# Patient Record
Sex: Female | Born: 1995 | Race: White | Hispanic: Yes | Marital: Single | Smoking: Never smoker
Health system: Southern US, Community
[De-identification: ages and names within clinical notes are randomized; demographics above are authoritative.]

## PROBLEM LIST (undated history)

## (undated) ENCOUNTER — Inpatient Hospital Stay (HOSPITAL_COMMUNITY): Payer: Self-pay

## (undated) DIAGNOSIS — T8859XA Other complications of anesthesia, initial encounter: Secondary | ICD-10-CM

## (undated) DIAGNOSIS — D649 Anemia, unspecified: Secondary | ICD-10-CM

## (undated) DIAGNOSIS — F329 Major depressive disorder, single episode, unspecified: Secondary | ICD-10-CM

## (undated) DIAGNOSIS — F32A Depression, unspecified: Secondary | ICD-10-CM

## (undated) DIAGNOSIS — T4145XA Adverse effect of unspecified anesthetic, initial encounter: Secondary | ICD-10-CM

---
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
## 2014-06-06 ENCOUNTER — Encounter (HOSPITAL_COMMUNITY): Payer: Self-pay | Admitting: Emergency Medicine

## 2015-01-18 ENCOUNTER — Encounter (HOSPITAL_COMMUNITY): Payer: Self-pay | Admitting: Emergency Medicine

## 2015-01-18 ENCOUNTER — Emergency Department (HOSPITAL_COMMUNITY): Payer: Medicaid Other

## 2015-01-18 ENCOUNTER — Emergency Department (HOSPITAL_COMMUNITY)
Admission: EM | Admit: 2015-01-18 | Discharge: 2015-01-18 | Disposition: A | Discharge status: Home / Self Care | Payer: Medicaid Other | Attending: Emergency Medicine | Admitting: Emergency Medicine

## 2015-01-18 DIAGNOSIS — Y92009 Unspecified place in unspecified non-institutional (private) residence as the place of occurrence of the external cause: Secondary | ICD-10-CM | POA: Insufficient documentation

## 2015-01-18 DIAGNOSIS — Z862 Personal history of diseases of the blood and blood-forming organs and certain disorders involving the immune mechanism: Secondary | ICD-10-CM | POA: Insufficient documentation

## 2015-01-18 DIAGNOSIS — S29001A Unspecified injury of muscle and tendon of front wall of thorax, initial encounter: Secondary | ICD-10-CM | POA: Diagnosis present

## 2015-01-18 DIAGNOSIS — Z792 Long term (current) use of antibiotics: Secondary | ICD-10-CM | POA: Insufficient documentation

## 2015-01-18 DIAGNOSIS — R0602 Shortness of breath: Secondary | ICD-10-CM | POA: Diagnosis not present

## 2015-01-18 DIAGNOSIS — W1789XA Other fall from one level to another, initial encounter: Secondary | ICD-10-CM | POA: Diagnosis not present

## 2015-01-18 DIAGNOSIS — S20212A Contusion of left front wall of thorax, initial encounter: Secondary | ICD-10-CM | POA: Insufficient documentation

## 2015-01-18 DIAGNOSIS — Y939 Activity, unspecified: Secondary | ICD-10-CM | POA: Insufficient documentation

## 2015-01-18 DIAGNOSIS — Z791 Long term (current) use of non-steroidal anti-inflammatories (NSAID): Secondary | ICD-10-CM | POA: Insufficient documentation

## 2015-01-18 DIAGNOSIS — Z8659 Personal history of other mental and behavioral disorders: Secondary | ICD-10-CM | POA: Diagnosis not present

## 2015-01-18 DIAGNOSIS — Y999 Unspecified external cause status: Secondary | ICD-10-CM | POA: Insufficient documentation

## 2015-01-18 DIAGNOSIS — W19XXXA Unspecified fall, initial encounter: Secondary | ICD-10-CM

## 2015-01-18 MED ORDER — IBUPROFEN 800 MG PO TABS: 800.0000 mg | ORAL_TABLET | Freq: Three times a day (TID) | ORAL | Status: DC

## 2015-01-18 NOTE — ED Provider Notes (Signed)
CSN: [id number]     Arrival date & time 01/18/15  1026 History  This chart was scribed for non-physician practitioner, Cheron Schaumann, PA-C working with No att. providers found by Placido Sou, ED scribe. This patient was seen in room TR04C/TR04C and the patient's care was started at 11:40 AM.      Chief Complaint  Patient presents with  . Fall    The history is provided by the patient. No language interpreter was used.    HPI Comments: Tracy Collins is a 19 y.o. female who presents to the Emergency Department complaining of a fall that occurred 3 days ago. She notes falling from the attic of her home about 6' to the floor below. She notes constant, moderate, generalized pain to her left side as well as difficulty breathing as associated symptoms. Pt denies taking any current medications as well as any known drug allergies. She denies pain in abdomen or any other associated symptoms.   Past Medical History  Diagnosis Date  . Depression   . Anemia   . Complication of anesthesia   . Anemia    Past Surgical History  Procedure Laterality Date  . Cesarean section     History reviewed. No pertinent family history. History  Substance Use Topics  . Smoking status: Never Smoker   . Smokeless tobacco: Not on file  . Alcohol Use: No   OB History    Gravida Para Term Preterm AB TAB SAB Ectopic Multiple Living   Review of Systems  Respiratory: Positive for shortness of breath.   Gastrointestinal: Negative for abdominal pain.  Musculoskeletal: Positive for myalgias.  All other systems reviewed and are negative.     Allergies  Review of patient's allergies indicates no known allergies.  Home Medications   Prior to Admission medications   Medication Sig Start Date End Date Taking? Authorizing Provider  doxycycline (VIBRAMYCIN) 100 MG capsule Take 1 capsule (100 mg total) by mouth 2 (two) times daily. 06/28/13   Linwood Dibbles, MD  naproxen (NAPROSYN) 500 MG tablet  Take 1 tablet (500 mg total) by mouth 2 (two) times daily. 06/28/13   Linwood Dibbles, MD   BP 113/70 mmHg  Pulse 66  Temp(Src) 98.2 F (36.8 C)  Resp 18  Ht 5' (1.524 m)  Wt 161 lb 8 oz (73.256 kg)  BMI 31.54 kg/m2  SpO2 98% Physical Exam  Constitutional: She is oriented to person, place, and time. She appears well-developed and well-nourished. No distress.  HENT:  Head: Normocephalic and atraumatic.  Mouth/Throat: Oropharynx is clear and moist.  Eyes: Conjunctivae and EOM are normal. Pupils are equal, round, and reactive to light.  Neck: Normal range of motion. Neck supple. No tracheal deviation present.  Cardiovascular: Normal rate.   Pulmonary/Chest: Breath sounds normal. No respiratory distress.  Abdominal: Soft.  Musculoskeletal: She exhibits tenderness.  Tender left anterior ribs,    Neurological: She is alert and oriented to person, place, and time.  Skin: Skin is warm and dry.  Psychiatric: She has a normal mood and affect. Her behavior is normal.  Nursing note and vitals reviewed.   ED Course  Procedures  DIAGNOSTIC STUDIES: Oxygen Saturation is 98% on RA, normal by my interpretation.    COORDINATION OF CARE: 11:42 AM Discussed treatment plan with pt at bedside including an x-ray of her left ribs and pt agreed to plan.  Labs Review Labs Reviewed - No data  to display  Imaging Review No results found.   EKG Interpretation None      MDM   Final diagnoses:  Fall  Contusion, chest wall, left, initial encounter   avs Ibuprofen Ice  I personally performed the services in this documentation, which was scribed in my presence.  The recorded information has been reviewed and considered.   Barnet Pall.  Lonia Skinner [place], PA-C 01/18/15 1350  Pricilla Loveless, MD 01/19/15 [id number]

## 2015-01-18 NOTE — ED Notes (Signed)
Pt reports falling out of the attic Sunday, neg LOC. Pt reports her left side and ribs hurt. No bruising or abrasions noted. VSS.

## 2015-01-18 NOTE — Discharge Instructions (Signed)

## 2015-07-31 ENCOUNTER — Encounter (HOSPITAL_COMMUNITY): Payer: Self-pay | Admitting: *Deleted

## 2015-07-31 ENCOUNTER — Emergency Department (HOSPITAL_COMMUNITY)
Admission: EM | Admit: 2015-07-31 | Discharge: 2015-07-31 | Disposition: A | Discharge status: Home / Self Care | Payer: Medicaid Other | Attending: Emergency Medicine | Admitting: Emergency Medicine

## 2015-07-31 DIAGNOSIS — Z862 Personal history of diseases of the blood and blood-forming organs and certain disorders involving the immune mechanism: Secondary | ICD-10-CM | POA: Insufficient documentation

## 2015-07-31 DIAGNOSIS — Z8659 Personal history of other mental and behavioral disorders: Secondary | ICD-10-CM | POA: Insufficient documentation

## 2015-07-31 DIAGNOSIS — Z792 Long term (current) use of antibiotics: Secondary | ICD-10-CM | POA: Insufficient documentation

## 2015-07-31 DIAGNOSIS — M791 Myalgia: Secondary | ICD-10-CM | POA: Insufficient documentation

## 2015-07-31 DIAGNOSIS — J020 Streptococcal pharyngitis: Secondary | ICD-10-CM | POA: Insufficient documentation

## 2015-07-31 DIAGNOSIS — Z3202 Encounter for pregnancy test, result negative: Secondary | ICD-10-CM | POA: Insufficient documentation

## 2015-07-31 DIAGNOSIS — Z791 Long term (current) use of non-steroidal anti-inflammatories (NSAID): Secondary | ICD-10-CM | POA: Insufficient documentation

## 2015-07-31 LAB — URINE MICROSCOPIC-ADD ON

## 2015-07-31 LAB — URINALYSIS, ROUTINE W REFLEX MICROSCOPIC
Bilirubin Urine: NEGATIVE
Glucose, UA: NEGATIVE mg/dL
KETONES UR: NEGATIVE mg/dL
NITRITE: NEGATIVE
PROTEIN: NEGATIVE mg/dL
Specific Gravity, Urine: 1.023 (ref 1.005–1.030)
pH: 5.5 (ref 5.0–8.0)

## 2015-07-31 LAB — RAPID STREP SCREEN (MED CTR MEBANE ONLY): STREPTOCOCCUS, GROUP A SCREEN (DIRECT): POSITIVE — AB

## 2015-07-31 LAB — PREGNANCY, URINE: PREG TEST UR: NEGATIVE

## 2015-07-31 MED ORDER — PENICILLIN V POTASSIUM 500 MG PO TABS: 500.0000 mg | ORAL_TABLET | Freq: Two times a day (BID) | ORAL | Status: AC

## 2015-07-31 NOTE — ED Provider Notes (Signed)
CSN: [id number]     Arrival date & time 07/31/15  1759 History   First MD Initiated Contact with Patient 07/31/15 1834     Chief Complaint  Patient presents with  . Sore Throat  . Generalized Body Aches  . Headache   HPI   Tracy Collins is a 19 y.o. F PMH significant for anemia presenting with a sore throat, mild, frontal headache, and body aches since Friday. She endorses subjective fevers and emesis (NBNB, one episode today and 3-4 times Friday). She denies ill contacts, nasal congestion, cough, abdominal pain, CP, SOB, drooling, trouble swallowing.   Past Medical History  Diagnosis Date  . Depression   . Anemia   . Complication of anesthesia   . Anemia    Past Surgical History  Procedure Laterality Date  . Cesarean section     History reviewed. No pertinent family history. Social History  Substance Use Topics  . Smoking status: Never Smoker   . Smokeless tobacco: None  . Alcohol Use: No   OB History    Gravida Para Term Preterm AB TAB SAB Ectopic Multiple Living   1 1        1      Review of Systems  Ten systems are reviewed and are negative for acute change except as noted in the HPI  Allergies  Review of patient's allergies indicates no known allergies.  Home Medications   Prior to Admission medications   Medication Sig Start Date End Date Taking? Authorizing Provider  doxycycline (VIBRAMYCIN) 100 MG capsule Take 1 capsule (100 mg total) by mouth 2 (two) times daily. 06/28/13   Linwood DibblesJon Knapp, MD  ibuprofen (ADVIL,MOTRIN) 800 MG tablet Take 1 tablet (800 mg total) by mouth 3 (three) times daily. 01/18/15   Elson AreasLeslie K Sofia, PA-C  naproxen (NAPROSYN) 500 MG tablet Take 1 tablet (500 mg total) by mouth 2 (two) times daily. 06/28/13   Linwood DibblesJon Knapp, MD   BP 117/88 mmHg  Pulse 98  Temp(Src) 98.7 F (37.1 C) (Oral)  Resp 18  Ht 5' (1.524 m)  SpO2 97% Physical Exam  Constitutional: She appears well-developed and well-nourished. No distress.  HENT:  Head: Normocephalic  and atraumatic.  Right Ear: External ear normal.  Left Ear: External ear normal.  Nose: Nose normal.  Mouth/Throat: Oropharyngeal exudate present.  Eyes: Conjunctivae are normal. Pupils are equal, round, and reactive to light. Right eye exhibits no discharge. Left eye exhibits no discharge. No scleral icterus.  Neck: No tracheal deviation present.  Cardiovascular: Normal rate, regular rhythm, normal heart sounds and intact distal pulses.  Exam reveals no gallop and no friction rub.   No murmur heard. Pulmonary/Chest: Effort normal and breath sounds normal. No respiratory distress. She has no wheezes. She has no rales. She exhibits no tenderness.  Abdominal: Soft. Bowel sounds are normal. She exhibits no distension and no mass. There is no tenderness. There is no rebound and no guarding.  Musculoskeletal: She exhibits no edema.  Lymphadenopathy:    She has no cervical adenopathy.  Neurological: She is alert. Coordination normal.  Skin: Skin is warm and dry. No rash noted. She is not diaphoretic. No erythema.  Psychiatric: She has a normal mood and affect. Her behavior is normal.  Nursing note and vitals reviewed.   ED Course  Procedures  Labs Review Labs Reviewed  RAPID STREP SCREEN (NOT [facility]) - Abnormal; Notable for the following:    Streptococcus, Group A Screen (Direct) POSITIVE (*)    All  other components within normal limits  URINALYSIS, ROUTINE W REFLEX MICROSCOPIC (NOT AT [facility]) - Abnormal; Notable for the following:    APPearance HAZY (*)    Hgb urine dipstick MODERATE (*)    Leukocytes, UA SMALL (*)    All other components within normal limits  URINE MICROSCOPIC-ADD ON - Abnormal; Notable for the following:    Squamous Epithelial / LPF 6-30 (*)    Bacteria, UA FEW (*)    All other components within normal limits  PREGNANCY, URINE    MDM   Final diagnoses:  Strep throat   Patient non-toxic appearing and VSS. UA with leukocytes and hematuria. Elevated  squamous/epithelials and few bacteria, likely contamination. Negative pregnancy. Positive strep.   Patient may be safely discharged home with PCN. Discussed reasons for return. Patient to follow-up with primary care provider within one week. Patient in understanding and agreement with the plan.   Melton Krebs, PA-C 08/14/15 5284  Leta Baptist, MD 08/20/15 [phone]

## 2015-07-31 NOTE — Discharge Instructions (Signed)
Ms. Levora AngelVanessa Balentine,  Nice meeting you! Please follow-up with your primary care provider as needed. Return to the emergency department if you do not improve on antibiotics. Feel better soon!  S. Lane HackerNicole Noella Kipnis, PA-C

## 2015-07-31 NOTE — ED Notes (Signed)
Pt reports generalized body aches, sore throat, and headache since Friday. Pt denies taking OTC medications at home. Pt reports fever at home but unknown high how. Pt states she vomited once today and yesterday, 3-4 times Friday and Saturday

## 2015-08-31 ENCOUNTER — Emergency Department (HOSPITAL_COMMUNITY): Payer: Self-pay

## 2015-08-31 ENCOUNTER — Encounter (HOSPITAL_COMMUNITY): Payer: Self-pay | Admitting: Emergency Medicine

## 2015-08-31 ENCOUNTER — Emergency Department (HOSPITAL_COMMUNITY)
Admission: EM | Admit: 2015-08-31 | Discharge: 2015-08-31 | Disposition: A | Discharge status: Home / Self Care | Payer: Self-pay | Attending: Emergency Medicine | Admitting: Emergency Medicine

## 2015-08-31 DIAGNOSIS — Z8659 Personal history of other mental and behavioral disorders: Secondary | ICD-10-CM | POA: Insufficient documentation

## 2015-08-31 DIAGNOSIS — Z862 Personal history of diseases of the blood and blood-forming organs and certain disorders involving the immune mechanism: Secondary | ICD-10-CM | POA: Insufficient documentation

## 2015-08-31 DIAGNOSIS — R102 Pelvic and perineal pain: Secondary | ICD-10-CM | POA: Insufficient documentation

## 2015-08-31 DIAGNOSIS — Z3202 Encounter for pregnancy test, result negative: Secondary | ICD-10-CM | POA: Insufficient documentation

## 2015-08-31 DIAGNOSIS — R52 Pain, unspecified: Secondary | ICD-10-CM

## 2015-08-31 LAB — URINALYSIS, ROUTINE W REFLEX MICROSCOPIC
Bilirubin Urine: NEGATIVE
Glucose, UA: NEGATIVE mg/dL
Ketones, ur: NEGATIVE mg/dL
Nitrite: NEGATIVE
Protein, ur: NEGATIVE mg/dL
Specific Gravity, Urine: 1.025 (ref 1.005–1.030)
pH: 6.5 (ref 5.0–8.0)

## 2015-08-31 LAB — COMPREHENSIVE METABOLIC PANEL
ALK PHOS: 111 U/L (ref 38–126)
ALT: 29 U/L (ref 14–54)
ANION GAP: 6 (ref 5–15)
AST: 23 U/L (ref 15–41)
Albumin: 4.2 g/dL (ref 3.5–5.0)
BUN: 11 mg/dL (ref 6–20)
CO2: 25 mmol/L (ref 22–32)
CREATININE: 0.59 mg/dL (ref 0.44–1.00)
Calcium: 9.2 mg/dL (ref 8.9–10.3)
Chloride: 108 mmol/L (ref 101–111)
GFR calc Af Amer: >60 mL/min (ref >60)
GFR calc non Af Amer: >60 mL/min (ref >60)
Glucose, Bld: 82 mg/dL (ref 65–99)
Potassium: 4.3 mmol/L (ref 3.5–5.1)
SODIUM: 139 mmol/L (ref 135–145)
Total Bilirubin: 0.6 mg/dL (ref 0.3–1.2)
Total Protein: 7.9 g/dL (ref 6.5–8.1)

## 2015-08-31 LAB — ABO/RH: ABO/RH(D): O POS

## 2015-08-31 LAB — CBC
HCT: 42.3 % (ref 36.0–46.0)
HEMOGLOBIN: 14 g/dL (ref 12.0–15.0)
MCH: 30 pg (ref 26.0–34.0)
MCHC: 33.1 g/dL (ref 30.0–36.0)
MCV: 90.6 fL (ref 78.0–100.0)
Platelets: 247 10*3/uL (ref 150–400)
RBC: 4.67 MIL/uL (ref 3.87–5.11)
RDW: 13 % (ref 11.5–15.5)
WBC: 7.8 10*3/uL (ref 4.0–10.5)

## 2015-08-31 LAB — TYPE AND SCREEN
ABO/RH(D): O POS
Antibody Screen: NEGATIVE

## 2015-08-31 LAB — WET PREP, GENITAL
Clue Cells Wet Prep HPF POC: NONE SEEN
Sperm: NONE SEEN
Trich, Wet Prep: NONE SEEN
Yeast Wet Prep HPF POC: NONE SEEN

## 2015-08-31 LAB — URINE MICROSCOPIC-ADD ON

## 2015-08-31 LAB — PREGNANCY, URINE: Preg Test, Ur: NEGATIVE

## 2015-08-31 LAB — POC OCCULT BLOOD, ED: FECAL OCCULT BLD: NEGATIVE

## 2015-08-31 NOTE — ED Notes (Signed)
Bed: WA16 Expected date:  Expected time:  Means of arrival:  Comments: Hold for triage 2 

## 2015-08-31 NOTE — Discharge Instructions (Signed)
Please read attached information. If you experience any new or worsening signs or symptoms please return to the emergency room for evaluation. Please follow-up with your primary care provider or specialist as discussed. Please use medication prescribed only as directed and discontinue taking if you have any concerning signs or symptoms.   °

## 2015-08-31 NOTE — Progress Notes (Signed)
Pt seen by [facility] staff and given resources

## 2015-08-31 NOTE — ED Provider Notes (Signed)
CSN: [id number]     Arrival date & time 08/31/15  4098 History   First MD Initiated Contact with Patient 08/31/15 1135     Chief Complaint  Patient presents with  . Abdominal Pain   HPI   G1 P50 female presents today with abdominal pain. Patient reports for the last 2 weeks she's had bilateral lower abdominal cramps with localization to the left pelvis. She reports that symptoms are persistent, with waxing and waning. She reports are not associated with any activity. She denies any vaginal discharge or bleeding, no history of the same. She denies any nausea, vomiting, diarrhea, upper abdominal pain. Patient has attempted ibuprofen at home with no significant improvement in symptoms. Patient states that she was on Daypro Provera, but her insurance expired and she was not able to receive her next injection which would have been on January 9. Patient reports that her last normal menstrual cycle was prior to starting Depakote, but has had intermittent vaginal spotting since. Patient reports that she is not sexually active at this time.  Patient also notes that she's been experiencing constipation with associated bleeding per rectum. She reports bleeding is only present with bowel movements, most bowel movements produce red streaking blood on the toilet paper, very small amounts. She reports pain associated with the bowel movement, no anal after before.    Past Medical History  Diagnosis Date  . Depression   . Anemia   . Complication of anesthesia   . Anemia    Past Surgical History  Procedure Laterality Date  . Cesarean section     No family history on file. Social History  Substance Use Topics  . Smoking status: Never Smoker   . Smokeless tobacco: None  . Alcohol Use: No   OB History    Gravida Para Term Preterm AB TAB SAB Ectopic Multiple Living   Review of Systems  All other systems reviewed and are negative.   Allergies  Review of patient's allergies indicates  no known allergies.  Home Medications   Prior to Admission medications   Not on File   BP 118/74 mmHg  Pulse 80  Temp(Src) 98.9 F (37.2 C) (Oral)  Resp 17  SpO2 100%   Physical Exam  Constitutional: She is oriented to person, place, and time. She appears well-developed and well-nourished.  HENT:  Head: Normocephalic and atraumatic.  Eyes: Conjunctivae are normal. Pupils are equal, round, and reactive to light. Right eye exhibits no discharge. Left eye exhibits no discharge. No scleral icterus.  Neck: Normal range of motion. No JVD present. No tracheal deviation present.  Pulmonary/Chest: Effort normal. No stridor.  Abdominal: Soft. She exhibits no distension and no mass. There is tenderness. There is no rebound and no guarding.  Tenderness to palpation of the left lower pelvic region  Musculoskeletal: Normal range of motion. She exhibits no edema or tenderness.  Neurological: She is alert and oriented to person, place, and time. Coordination normal.  Skin: Skin is warm and dry. No rash noted. No erythema.  Psychiatric: She has a normal mood and affect. Her behavior is normal. Judgment and thought content normal.  Nursing note and vitals reviewed.     ED Course  Procedures (including critical care time) Labs Review Labs Reviewed  WET PREP, GENITAL - Abnormal; Notable for the following:    WBC, Wet Prep HPF POC MANY (*)    All other components within  normal limits  URINALYSIS, ROUTINE W REFLEX MICROSCOPIC (NOT AT [facility]) - Abnormal; Notable for the following:    Hgb urine dipstick TRACE (*)    Leukocytes, UA SMALL (*)    All other components within normal limits  URINE MICROSCOPIC-ADD ON - Abnormal; Notable for the following:    Squamous Epithelial / LPF 0-5 (*)    Bacteria, UA FEW (*)    All other components within normal limits  COMPREHENSIVE METABOLIC PANEL  CBC  PREGNANCY, URINE  HIV ANTIBODY (ROUTINE TESTING)  POC OCCULT BLOOD, ED  TYPE AND SCREEN  ABO/RH   GC/CHLAMYDIA PROBE AMP ([place]) NOT AT [facility]    Imaging Review US Transvaginal Non-ob  08/31/2015  CLINICAL DATA:  Pelvic pain for 1 month EXAM: TRANSABDOMINAL AND TRANSVAGINAL ULTRASOUND OF PELVIS TECHNIQUE: Study was performed transabdominally to optimize pelvic field of view evaluation and transvaginally to optimize internal visceral architecture evaluation. COMPARISON:  None FINDINGS: Uterus Measurements: 8.6 x 2.5 x 3.8 cm. No fibroids or other mass visualized. Endometrium Thickness: 3 mm. There is fluid within the endometrium. Endometrial contour is smooth. Right ovary Measurements: 2.3 x 1.8 x 2.1 cm. Normal appearance/no adnexal mass. Left ovary Measurements: 2.5 x 1.9 x 2.0 cm. Normal appearance/no adnexal mass. Other findings No abnormal free fluid. IMPRESSION: The endometrium is not thickened. Fluid is noted in the endometrium. This fluid may represent hemorrhage. No contour irregularity or mass is appreciable within the endometrium. Study is otherwise unremarkable. Electronically Signed   By: Bretta Bang III M.D.   On: 08/31/2015 15:26   US Pelvis Complete  08/31/2015  CLINICAL DATA:  Pelvic pain for 1 month EXAM: TRANSABDOMINAL AND TRANSVAGINAL ULTRASOUND OF PELVIS TECHNIQUE: Study was performed transabdominally to optimize pelvic field of view evaluation and transvaginally to optimize internal visceral architecture evaluation. COMPARISON:  None FINDINGS: Uterus Measurements: 8.6 x 2.5 x 3.8 cm. No fibroids or other mass visualized. Endometrium Thickness: 3 mm. There is fluid within the endometrium. Endometrial contour is smooth. Right ovary Measurements: 2.3 x 1.8 x 2.1 cm. Normal appearance/no adnexal mass. Left ovary Measurements: 2.5 x 1.9 x 2.0 cm. Normal appearance/no adnexal mass. Other findings No abnormal free fluid. IMPRESSION: The endometrium is not thickened. Fluid is noted in the endometrium. This fluid may represent hemorrhage. No contour irregularity or mass is  appreciable within the endometrium. Study is otherwise unremarkable. Electronically Signed   By: Bretta Bang III M.D.   On: 08/31/2015 15:26   I have personally reviewed and evaluated these images and lab results as part of my medical decision-making.   EKG Interpretation None      MDM   Final diagnoses:  Pelvic pain in female    Labs: 20 care occult blood, type and screen, CBC, CMP, AB Rh- normal  Imaging: Pelvic ultrasound  Consults:  Therapeutics:  Discharge Meds:   Assessment/Plan: 20 year old female presents today with lower pelvic pain. Initially 5 out of 10 pain, by the time of evaluation was complete patient had very minimal pain. Ultrasound showed no acute findings, lab no acute findings. No need to further evaluate pelvic pain here in ED setting. This is likely due to change in contraceptives. Patient also had reports of blood per rectum, no blood on fecal occult.  Patient describes ripping pain with defecation, no pain without defecation. This likely represents hemorrhoids, patient is instructed to increase her fiber, fluids, follow-up with primary care if symptoms persist. Patient given strict return precautions, verbalized understanding and agreement for today's plan had  no further questions or concerns at time of discharge         Eyvonne Mechanic, PA-C 08/31/15 1645  Mancel Bale, MD 09/01/15 [phone]

## 2015-08-31 NOTE — ED Notes (Signed)
Per pt, states abdominal pain and bleeding when she has a BM-states it has been happening for over a month

## 2015-08-31 NOTE — ED Notes (Signed)
Patient transported to Ultrasound 

## 2015-08-31 NOTE — ED Notes (Signed)
Patient presents to ED with 1 month hx of bright red rectal bleeding.  Patient denies vaginal bleeding and discharge.  Patient c/o nausea, but denies vomiting.  Patient states she occasionally strains to have a BM.  On exam, POC rectal occult blood sample obtained.  No stool in vault.  No external hemorrhoids noted.  Patient's abdomen soft and non-tender to palpation.

## 2015-09-01 LAB — GC/CHLAMYDIA PROBE AMP (~~LOC~~) NOT AT ARMC
Chlamydia: POSITIVE — AB
Neisseria Gonorrhea: NEGATIVE

## 2015-09-01 LAB — HIV ANTIBODY (ROUTINE TESTING W REFLEX): HIV Screen 4th Generation wRfx: NONREACTIVE

## 2015-09-04 ENCOUNTER — Telehealth (HOSPITAL_BASED_OUTPATIENT_CLINIC_OR_DEPARTMENT_OTHER): Payer: Self-pay | Admitting: Emergency Medicine

## 2015-09-04 NOTE — Telephone Encounter (Signed)
Chart handoff to EDP for treatment plan for + Chlamydia 

## 2015-09-06 ENCOUNTER — Telehealth (HOSPITAL_BASED_OUTPATIENT_CLINIC_OR_DEPARTMENT_OTHER): Payer: Self-pay | Admitting: Emergency Medicine

## 2015-09-06 NOTE — Telephone Encounter (Signed)
Post ED Visit - Positive Culture Follow-up: Successful Patient Follow-Up  Culture assessed and recommendations reviewed by:  Enzo Bi, Pharm.D.  Celedonio Miyamoto, Pharm.D., BCPS  Garvin Fila, Pharm.D.  Georgina Pillion, Pharm.D., BCPS  [place], [place].D., BCPS, AAHIVP  Estella Husk, Pharm.D., BCPS, AAHIVP  Tennis Must, Pharm.D.  Sherle Poe, Pharm.D.  Positive chlamydia culture   Patient discharged without antimicrobial prescription and treatment is now indicated  Organism is resistant to prescribed ED discharge antimicrobial  Patient with positive blood cultures  Changes discussed with ED provider: Linwood Dibbles MD New antibiotic prescription zithromax 1 gram po  Attempting to notify pt    Berle Mull 09/06/2015, 2:29 PM

## 2015-09-08 ENCOUNTER — Telehealth (HOSPITAL_COMMUNITY): Payer: Self-pay

## 2015-09-08 NOTE — Telephone Encounter (Signed)
Pt called after receiving letter.  ID verified x 2.  Pt informed of dx and need for addl tx, notify partner and abstain from sex x 2 wks post tx.  Rx called to [facility] [phone] and left on the voicemail.

## 2016-02-25 ENCOUNTER — Inpatient Hospital Stay (HOSPITAL_COMMUNITY)
Admission: AD | Admit: 2016-02-25 | Discharge: 2016-02-25 | Disposition: A | Discharge status: Home / Self Care | Payer: Medicaid Other | Source: Ambulatory Visit | Attending: Family Medicine | Admitting: Family Medicine

## 2016-02-25 ENCOUNTER — Encounter (HOSPITAL_COMMUNITY): Payer: Self-pay

## 2016-02-25 ENCOUNTER — Inpatient Hospital Stay (HOSPITAL_COMMUNITY): Payer: Medicaid Other

## 2016-02-25 DIAGNOSIS — O2691 Pregnancy related conditions, unspecified, first trimester: Secondary | ICD-10-CM | POA: Diagnosis not present

## 2016-02-25 DIAGNOSIS — R1030 Lower abdominal pain, unspecified: Secondary | ICD-10-CM | POA: Diagnosis present

## 2016-02-25 DIAGNOSIS — Z3A08 8 weeks gestation of pregnancy: Secondary | ICD-10-CM | POA: Insufficient documentation

## 2016-02-25 DIAGNOSIS — R109 Unspecified abdominal pain: Secondary | ICD-10-CM

## 2016-02-25 DIAGNOSIS — O9989 Other specified diseases and conditions complicating pregnancy, childbirth and the puerperium: Secondary | ICD-10-CM

## 2016-02-25 DIAGNOSIS — O26899 Other specified pregnancy related conditions, unspecified trimester: Secondary | ICD-10-CM

## 2016-02-25 LAB — WET PREP, GENITAL
CLUE CELLS WET PREP: NONE SEEN
Sperm: NONE SEEN
TRICH WET PREP: NONE SEEN
YEAST WET PREP: NONE SEEN

## 2016-02-25 LAB — URINE MICROSCOPIC-ADD ON

## 2016-02-25 LAB — URINALYSIS, ROUTINE W REFLEX MICROSCOPIC
Bilirubin Urine: NEGATIVE
GLUCOSE, UA: NEGATIVE mg/dL
KETONES UR: 15 mg/dL — AB
Leukocytes, UA: NEGATIVE
Nitrite: NEGATIVE
PH: 6 (ref 5.0–8.0)
PROTEIN: NEGATIVE mg/dL
Specific Gravity, Urine: 1.025 (ref 1.005–1.030)

## 2016-02-25 LAB — POCT PREGNANCY, URINE: Preg Test, Ur: POSITIVE — AB

## 2016-02-25 LAB — CBC
HEMATOCRIT: 37.8 % (ref 36.0–46.0)
Hemoglobin: 13.2 g/dL (ref 12.0–15.0)
MCH: 30.7 pg (ref 26.0–34.0)
MCHC: 34.9 g/dL (ref 30.0–36.0)
MCV: 87.9 fL (ref 78.0–100.0)
PLATELETS: 218 10*3/uL (ref 150–400)
RBC: 4.3 MIL/uL (ref 3.87–5.11)
RDW: 12.4 % (ref 11.5–15.5)
WBC: 11.2 10*3/uL — ABNORMAL HIGH (ref 4.0–10.5)

## 2016-02-25 LAB — HCG, QUANTITATIVE, PREGNANCY: hCG, Beta Chain, Quant, S: 118528 m[IU]/mL — ABNORMAL HIGH (ref <5)

## 2016-02-25 NOTE — Discharge Instructions (Signed)
Your pregnancy test is positive.  No smoking, no drugs, no alcohol.  Take a prenatal vitamin one by mouth every day.  Eat small frequent snacks to avoid nausea.  Begin prenatal care as soon as possible. °Drink at least 8 8-oz glasses of water every day. °Take Tylenol 325 mg 2 tablets by mouth every 4 hours if needed for pain. ° °

## 2016-02-25 NOTE — MAU Note (Signed)
Patient presents with positive UPT at home, complaint of nausea, having abdominal pain in the area of previous C/S scar, was on Depo but came off so unsure of LMP.

## 2016-02-25 NOTE — MAU Provider Note (Signed)
History     CSN: [id number]  Arrival date and time: 02/25/16 0704   First Provider Initiated Contact with Patient 02/25/16 [phone]      Chief Complaint  Patient presents with  . Possible Pregnancy   HPI Tracy Collins 20 y.o. Comes to MAU with a positive home pregnancy test and intermittent low midline abdominal pain.  Pain is not present currently but has been bothering her.  Last Depo was in January and has had unprotected sex recently.    OB History    Gravida Para Term Preterm AB Living   2 1       1    SAB TAB Ectopic Multiple Live Births                  Past Medical History:  Diagnosis Date  . Anemia   . Anemia   . Complication of anesthesia   . Depression     Past Surgical History:  Procedure Laterality Date  . CESAREAN SECTION      History reviewed. No pertinent family history.  Social History  Substance Use Topics  . Smoking status: Never Smoker  . Smokeless tobacco: Never Used  . Alcohol use No    Allergies: No Known Allergies  No prescriptions prior to admission.    Review of Systems  Constitutional: Negative for fever.  Gastrointestinal: Positive for abdominal pain and nausea. Negative for vomiting.  Genitourinary:       No vaginal discharge. No vaginal bleeding. No dysuria.   Physical Exam   Blood pressure 126/72, pulse 75, temperature 98.5 F (36.9 C), temperature source Oral, resp. rate 18.  Physical Exam  Nursing note and vitals reviewed. Constitutional: She is oriented to person, place, and time. She appears well-developed and well-nourished.  HENT:  Head: Normocephalic.  Eyes: EOM are normal.  Neck: Neck supple.  GI: Soft. There is no tenderness.  Genitourinary:  Genitourinary Comments: Speculum exam: Vagina - Small amount of creamy discharge, no odor Cervix - No contact bleeding Bimanual exam: Cervix closed Uterus non tender, 6-8 week size Adnexa non tender, no masses bilaterally GC/Chlam, wet prep done Chaperone  present for exam.  Musculoskeletal: Normal range of motion.  Neurological: She is alert and oriented to person, place, and time.  Skin: Skin is warm and dry.  Psychiatric: She has a normal mood and affect.    MAU Course  Procedures Results for orders placed or performed during the hospital encounter of 02/25/16 (from the past 24 hour(s))  Urinalysis, Routine w reflex microscopic (not at [facility])     Status: Abnormal   Collection Time: 02/25/16  7:25 AM  Result Value Ref Range   Color, Urine YELLOW YELLOW   APPearance CLEAR CLEAR   Specific Gravity, Urine 1.025 1.005 - 1.030   pH 6.0 5.0 - 8.0   Glucose, UA NEGATIVE NEGATIVE mg/dL   Hgb urine dipstick SMALL (A) NEGATIVE   Bilirubin Urine NEGATIVE NEGATIVE   Ketones, ur 15 (A) NEGATIVE mg/dL   Protein, ur NEGATIVE NEGATIVE mg/dL   Nitrite NEGATIVE NEGATIVE   Leukocytes, UA NEGATIVE NEGATIVE  Urine microscopic-add on     Status: Abnormal   Collection Time: 02/25/16  7:25 AM  Result Value Ref Range   Squamous Epithelial / LPF 0-5 (A) NONE SEEN   WBC, UA 0-5 0 - 5 WBC/hpf   RBC / HPF 0-5 0 - 5 RBC/hpf   Bacteria, UA FEW (A) NONE SEEN   Urine-Other MUCOUS PRESENT   Pregnancy, urine POC  Status: Abnormal   Collection Time: 02/25/16  7:40 AM  Result Value Ref Range   Preg Test, Ur POSITIVE (A) NEGATIVE  CBC     Status: Abnormal   Collection Time: 02/25/16  7:54 AM  Result Value Ref Range   WBC 11.2 (H) 4.0 - 10.5 K/uL   RBC 4.30 3.87 - 5.11 MIL/uL   Hemoglobin 13.2 12.0 - 15.0 g/dL   HCT 16.1 09.6 - 04.5 %   MCV 87.9 78.0 - 100.0 fL   MCH 30.7 26.0 - 34.0 pg   MCHC 34.9 30.0 - 36.0 g/dL   RDW 40.9 81.1 - 91.4 %   Platelets 218 150 - 400 K/uL  Wet prep, genital     Status: Abnormal   Collection Time: 02/25/16  8:10 AM  Result Value Ref Range   Yeast Wet Prep HPF POC NONE SEEN NONE SEEN   Trich, Wet Prep NONE SEEN NONE SEEN   Clue Cells Wet Prep HPF POC NONE SEEN NONE SEEN   WBC, Wet Prep HPF POC FEW (A) NONE SEEN   Sperm  NONE SEEN     MDM Preliminary ultrasound report [redacted]w[redacted]d with EDC 10-06-16  Assessment and Plan  IUP at 8w 0d Abdominal pain in pregnancy - likely corpus luteum cyst  Plan Your pregnancy test is positive.  No smoking, no drugs, no alcohol.  Take a prenatal vitamin one by mouth every day.  Eat small frequent snacks to avoid nausea.  Begin prenatal care as soon as possible. Drink at least 8 8-oz glasses of water every day. Take Tylenol 325 mg 2 tablets by mouth every 4 hours if needed for pain.   BURLESON,TERRI 02/25/2016, 8:50 AM

## 2016-02-26 LAB — GC/CHLAMYDIA PROBE AMP (~~LOC~~) NOT AT ARMC
Chlamydia: NEGATIVE
NEISSERIA GONORRHEA: NEGATIVE

## 2016-12-30 ENCOUNTER — Encounter (HOSPITAL_COMMUNITY): Payer: Self-pay

## 2017-04-27 IMAGING — US US OB TRANSVAGINAL
1 series · 15 of 28 positions shown · non-contrast
Comparison: None.

CLINICAL DATA: Abdominal pain.

EXAM:
OBSTETRIC <14 WK US AND TRANSVAGINAL OB US
TECHNIQUE: Both transabdominal and transvaginal ultrasound examinations were
performed for complete evaluation of the gestation as well as the
maternal uterus, adnexal regions, and pelvic cul-de-sac.
Transvaginal technique was performed to assess early pregnancy.

[Series 1: us ob transvaginal · 15 of 32 slices shown]
[im 1/32]
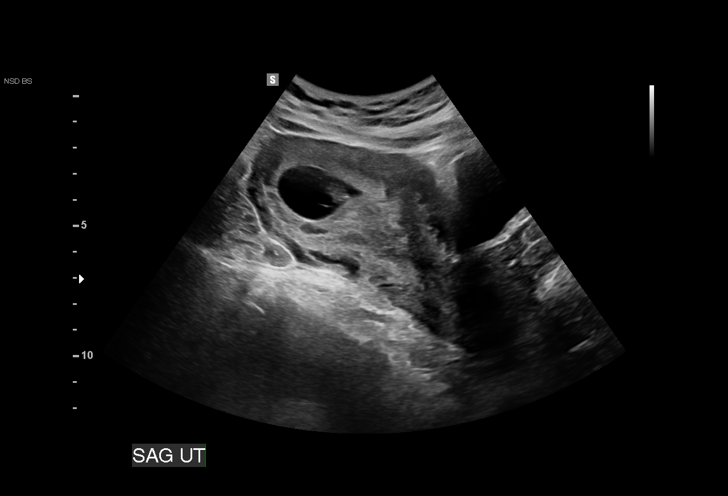
[im 3/32]
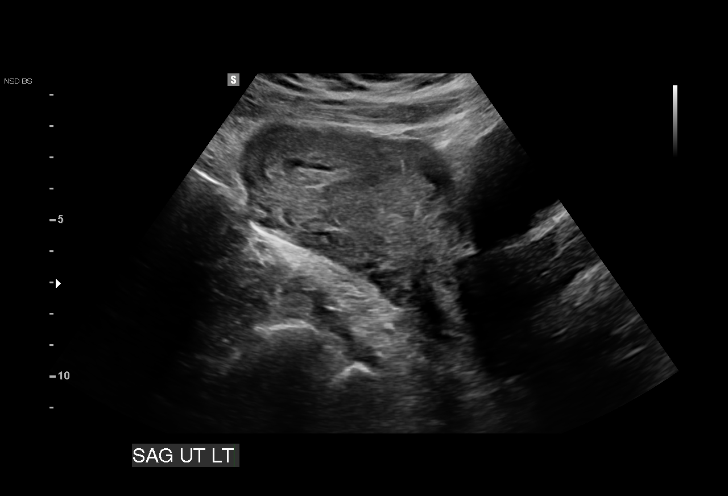
[im 5/32]
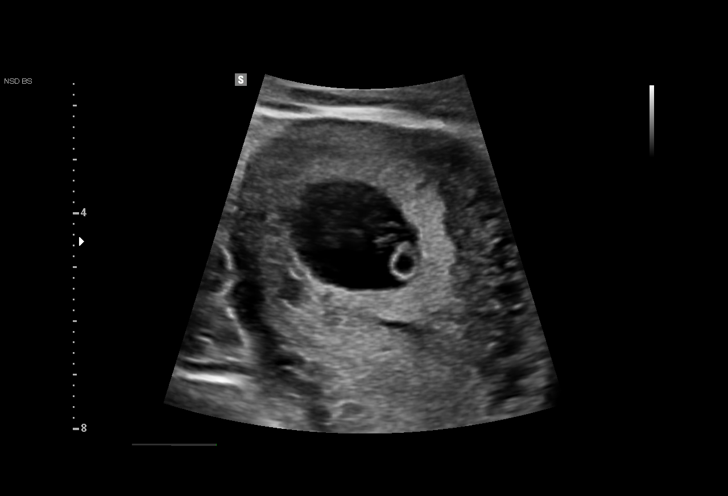
[im 7/32]
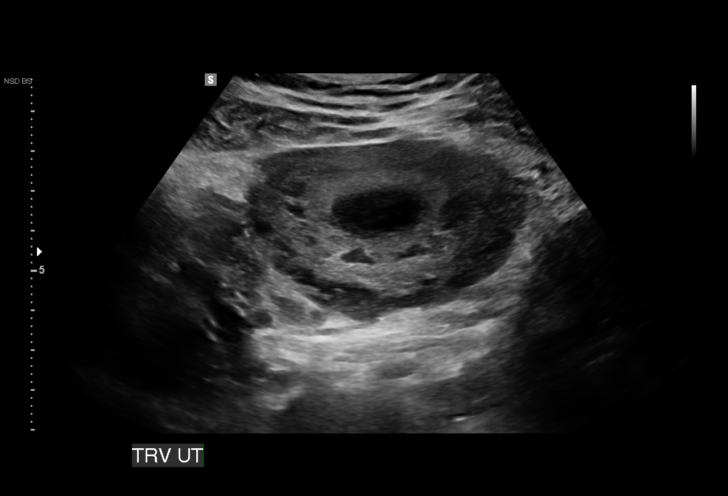
[im 10/32]
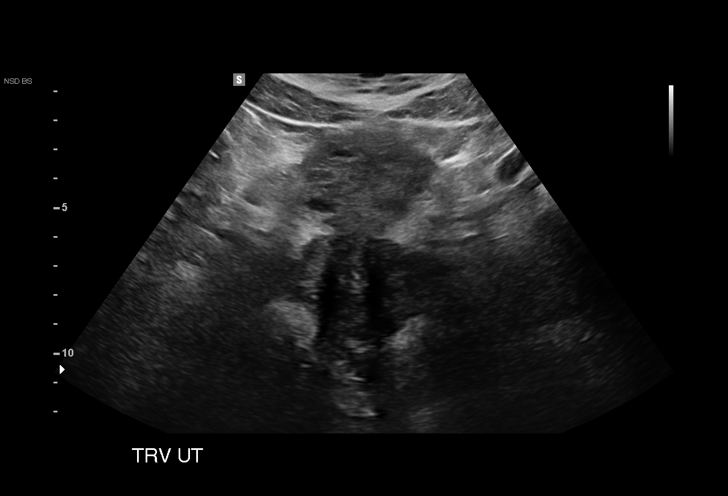
[im 12/32]
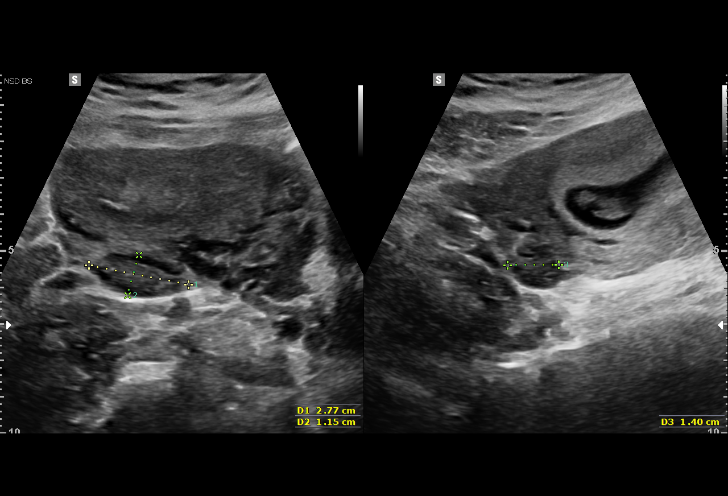
[im 14/32]
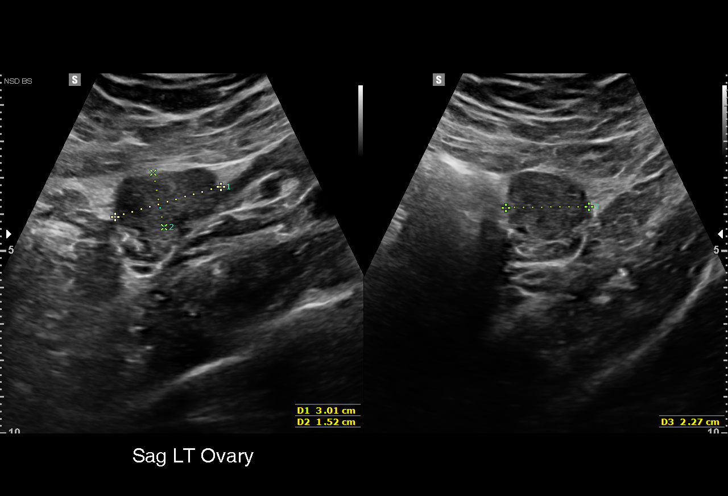
[im 17/32]
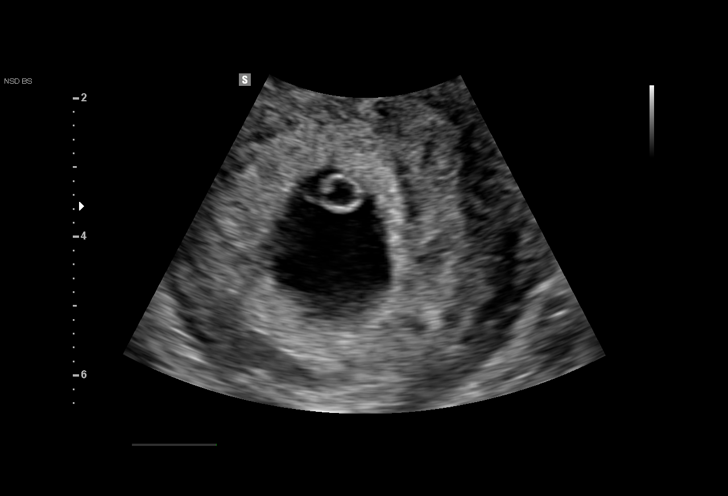
[im 18/32]
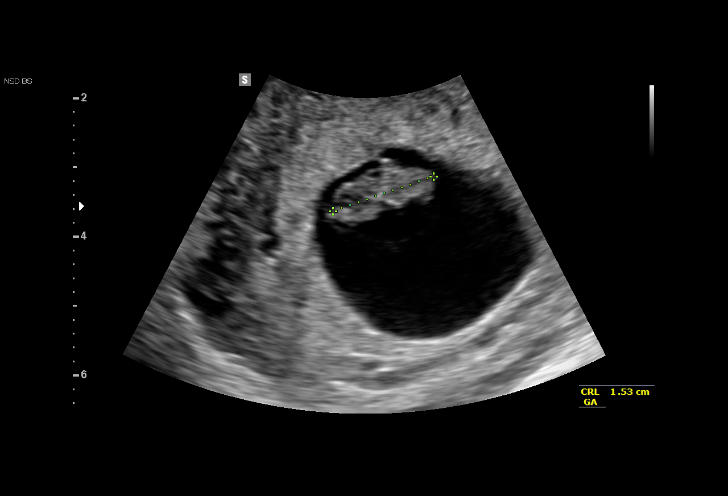
[im 20/32]
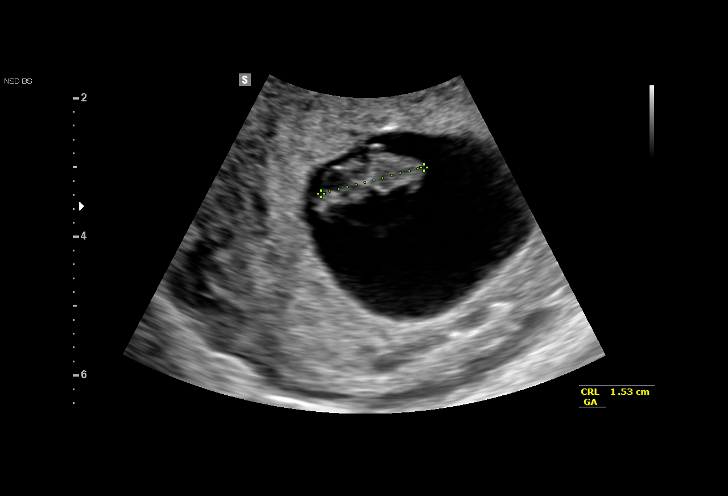
[im 22/32]
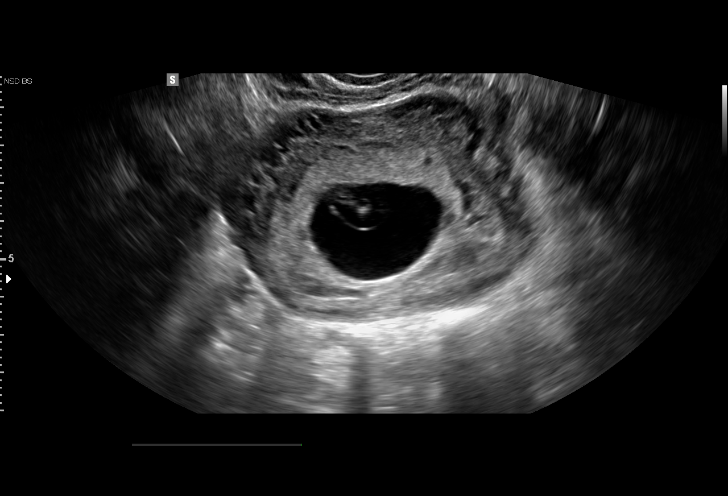
[im 25/32]
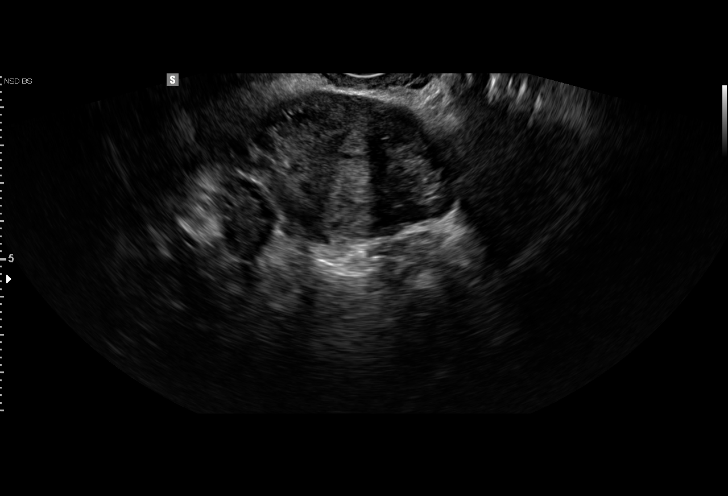
[im 27/32]
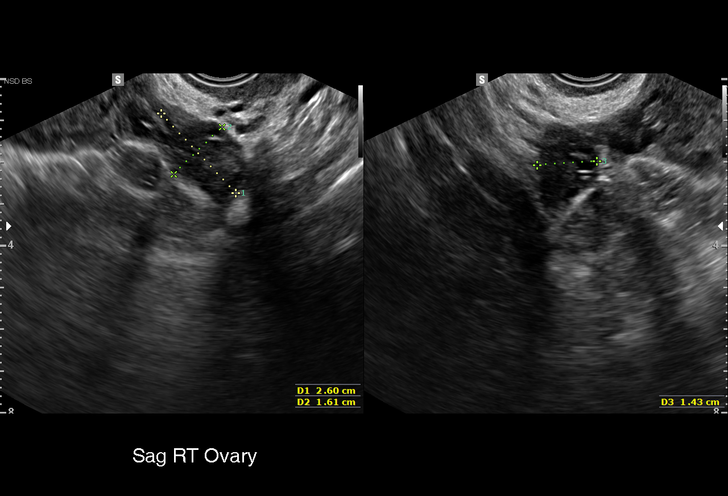
[im 29/32]
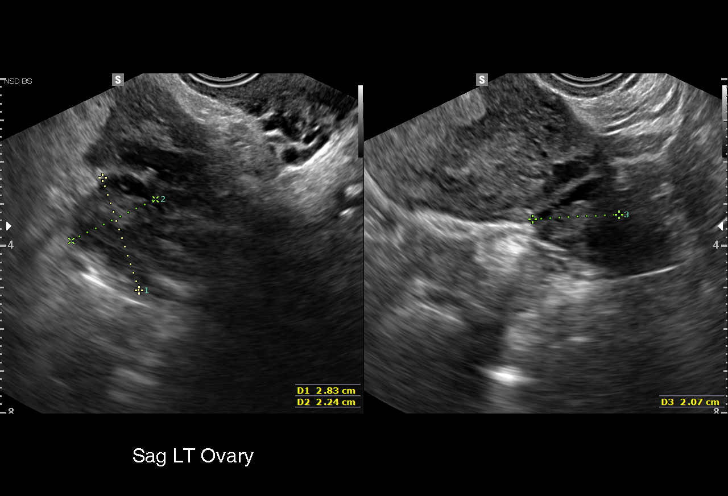
[im 32/32]
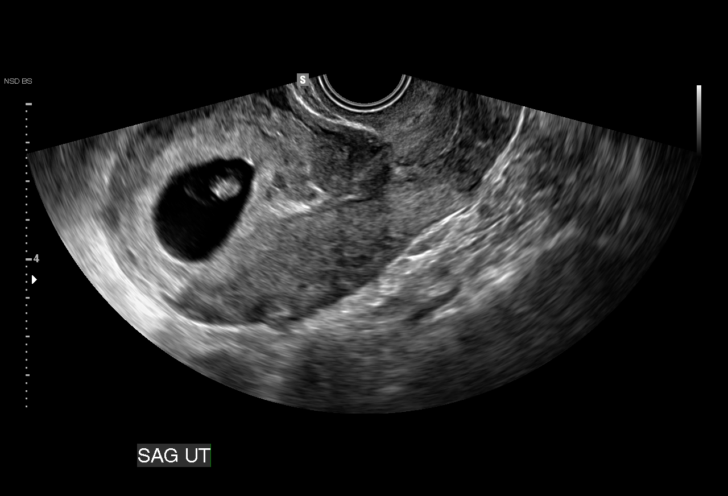

[15 of 28 positions shown; findings below may reference images not displayed]

FINDINGS: Intrauterine gestational sac: A single intrauterine gestational sac
is identified.

Yolk sac:  The yolk sac is visualized.

Embryo:  A fetal pole is visualized.

Cardiac Activity: Present

Heart Rate: 154 beats per minute  bpm

MSD:   mm    w     d

CRL:  156  mm   8 w   0 d                  US EDC: October 06, 2016

Subchorionic hemorrhage:  None visualized.

Maternal uterus/adnexae: The ovaries are unremarkable with a corpus
luteum cyst on the left.
IMPRESSION: 1. Single live IUP.  No cause for pain identified.
